# Patient Record
Sex: Male | Born: 1990 | Race: White | Hispanic: No | Marital: Single | Smoking: Current every day smoker
Health system: Southern US, Community
[De-identification: ages and names within clinical notes are randomized; demographics above are authoritative.]

## PROBLEM LIST (undated history)

## (undated) DIAGNOSIS — K4090 Unilateral inguinal hernia, without obstruction or gangrene, not specified as recurrent: Secondary | ICD-10-CM

---
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
## 2008-06-04 ENCOUNTER — Ambulatory Visit: Payer: Self-pay | Admitting: Family Medicine
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
## 2008-12-09 ENCOUNTER — Emergency Department: Payer: Self-pay | Admitting: Emergency Medicine

## 2008-12-12 ENCOUNTER — Emergency Department: Payer: Self-pay | Admitting: Emergency Medicine

## 2009-02-27 ENCOUNTER — Emergency Department: Payer: Self-pay | Admitting: Internal Medicine

## 2009-05-11 ENCOUNTER — Emergency Department: Payer: Self-pay | Admitting: Emergency Medicine

## 2009-07-14 ENCOUNTER — Emergency Department: Payer: Self-pay | Admitting: Emergency Medicine

## 2009-07-16 ENCOUNTER — Emergency Department: Payer: Self-pay | Admitting: Emergency Medicine

## 2009-07-23 ENCOUNTER — Emergency Department: Payer: Self-pay | Admitting: Emergency Medicine

## 2009-09-29 ENCOUNTER — Emergency Department: Payer: Self-pay | Admitting: Emergency Medicine

## 2010-04-27 ENCOUNTER — Emergency Department: Payer: Self-pay | Admitting: Emergency Medicine

## 2013-08-09 ENCOUNTER — Encounter (HOSPITAL_COMMUNITY): Payer: Self-pay | Admitting: Emergency Medicine

## 2013-08-09 ENCOUNTER — Emergency Department (HOSPITAL_COMMUNITY)
Admission: EM | Admit: 2013-08-09 | Discharge: 2013-08-10 | Disposition: A | Discharge status: Home / Self Care | Attending: Emergency Medicine | Admitting: Emergency Medicine

## 2013-08-09 DIAGNOSIS — R42 Dizziness and giddiness: Secondary | ICD-10-CM | POA: Insufficient documentation

## 2013-08-09 DIAGNOSIS — Z87891 Personal history of nicotine dependence: Secondary | ICD-10-CM | POA: Insufficient documentation

## 2013-08-09 DIAGNOSIS — K4090 Unilateral inguinal hernia, without obstruction or gangrene, not specified as recurrent: Secondary | ICD-10-CM | POA: Insufficient documentation

## 2013-08-09 HISTORY — DX: Unilateral inguinal hernia, without obstruction or gangrene, not specified as recurrent: K40.90

## 2013-08-09 NOTE — ED Notes (Signed)
Patient states he has had R inguinal hernia x 9 months.  Normally reduces when he lies down at night, but tonight it wouldn't and it is painful.

## 2013-08-09 NOTE — ED Notes (Signed)
Patient is from [facility]; patient has left inguinal hernia.  States having difficulty getting it back in and that it is painful.

## 2013-08-09 NOTE — ED Notes (Signed)
[employer] x 2 w/patient.

## 2013-08-09 NOTE — ED Provider Notes (Signed)
CSN: [id number]     Arrival date & time 08/09/13  2249 History  This chart was scribed for Brian Roller, MD by Quintella Reichert, ED scribe.  This patient was seen in room APA15/APA15 and the patient's care was started at 11:35 PM.   Chief Complaint  Patient presents with  . Inguinal Hernia    The history is provided by the patient. No language interpreter was used.    HPI Comments: Brian Stuart is a 22 y.o. male who presents to the Emergency Department complaining of a left inguinal hernia that has been present for 9 months but today became more painful and extended into his scrotum.  He states that the hernia normally pops out while standing up and today it popped out while he was standing up taking a shower.  Presently he complains of constant moderate-to-severe pain localized to the area of the hernia.  He is normally able to reduce the hernia by lying down but today he has not been able to.  He states he also felt mild lightheaded earlier today when his pain was most severe but he denies nausea, vomiting, or any other associated symptoms.      Past Medical History  Diagnosis Date  . Inguinal hernia     History reviewed. No pertinent past surgical history.  No family history on file.   History  Substance Use Topics  . Smoking status: Former Games developer  . Smokeless tobacco: Not on file  . Alcohol Use: No     Review of Systems  All other systems reviewed and are negative.     Allergies  Review of patient's allergies indicates no known allergies.  Home Medications  No current outpatient prescriptions on file.  BP 133/89  Pulse 72  Temp(Src) 98 F (36.7 C) (Oral)  Resp 18  Ht 5\' 5"  (1.651 m)  Wt 132 lb (59.875 kg)  BMI 21.97 kg/m2  SpO2 99%  Physical Exam  Nursing note and vitals reviewed. Constitutional: He is oriented to person, place, and time. He appears well-developed and well-nourished. No distress.  HENT:  Head: Normocephalic and atraumatic.   Mouth/Throat: Oropharynx is clear and moist.  Eyes: Conjunctivae are normal. Pupils are equal, round, and reactive to light. No scleral icterus.  Neck: Neck supple.  Cardiovascular: Normal rate, regular rhythm, normal heart sounds and intact distal pulses.   No murmur heard. Pulmonary/Chest: Effort normal and breath sounds normal. No stridor. No respiratory distress. He has no wheezes. He has no rales.  Abdominal: Soft. Bowel sounds are normal. He exhibits no distension. There is no tenderness. There is no rebound and no guarding. Hernia confirmed negative in the right inguinal area and confirmed negative in the left inguinal area.  Genitourinary: Testes normal.  Scrotum normal.  No inguinal hernia palpated.  Musculoskeletal: Normal range of motion. He exhibits no edema.  Neurological: He is alert and oriented to person, place, and time.  Skin: Skin is warm and dry. No rash noted.  Psychiatric: He has a normal mood and affect. His behavior is normal.    ED Course  Procedures (including critical care time)  DIAGNOSTIC STUDIES: Oxygen Saturation is 99% on room air, normal by my interpretation.    COORDINATION OF CARE: 11:38 PM-Discussed treatment plan with pt at bedside and pt agreed to plan.    Labs Review Labs Reviewed - No data to display  Imaging Review No results found.  EKG Interpretation   None       MDM  1. Left inguinal hernia    Pt has no hernia on my exam - he states that prior to my exam it reduced spontaneously.  No distress at this time - hernia belt Rx.    I personally performed the services described in this documentation, which was scribed in my presence. The recorded information has been reviewed and is accurate. '     Brian Roller, MD 08/09/13 2356

## 2013-08-10 NOTE — ED Notes (Addendum)
Patient with no complaints at this time. Respirations even and unlabored. Skin warm/dry. Discharge instructions reviewed with patient at this time. Patient given opportunity to voice concerns/ask questions. Patient discharged at this time and left Emergency Department with steady gait.   

## 2013-08-10 NOTE — ED Notes (Signed)
Report called to Teodora Medici, RN triage nurse.

## 2017-08-30 ENCOUNTER — Emergency Department
Admission: EM | Admit: 2017-08-30 | Discharge: 2017-08-30 | Disposition: A | Discharge status: Home / Self Care | Payer: Self-pay | Attending: Emergency Medicine | Admitting: Emergency Medicine

## 2017-08-30 ENCOUNTER — Emergency Department: Payer: Self-pay

## 2017-08-30 ENCOUNTER — Other Ambulatory Visit: Payer: Self-pay

## 2017-08-30 ENCOUNTER — Encounter: Payer: Self-pay | Admitting: Emergency Medicine

## 2017-08-30 DIAGNOSIS — N200 Calculus of kidney: Secondary | ICD-10-CM | POA: Insufficient documentation

## 2017-08-30 DIAGNOSIS — F17210 Nicotine dependence, cigarettes, uncomplicated: Secondary | ICD-10-CM | POA: Insufficient documentation

## 2017-08-30 DIAGNOSIS — N50819 Testicular pain, unspecified: Secondary | ICD-10-CM

## 2017-08-30 LAB — URINALYSIS, COMPLETE (UACMP) WITH MICROSCOPIC
BILIRUBIN URINE: NEGATIVE
Bacteria, UA: NONE SEEN
GLUCOSE, UA: NEGATIVE mg/dL
Ketones, ur: 5 mg/dL — AB
LEUKOCYTES UA: NEGATIVE
NITRITE: NEGATIVE
PH: 5 (ref 5.0–8.0)
Protein, ur: NEGATIVE mg/dL
SPECIFIC GRAVITY, URINE: 1.011 (ref 1.005–1.030)
Squamous Epithelial / LPF: NONE SEEN

## 2017-08-30 LAB — BASIC METABOLIC PANEL
ANION GAP: 10 (ref 5–15)
BUN: 13 mg/dL (ref 6–20)
CALCIUM: 9.5 mg/dL (ref 8.9–10.3)
CO2: 24 mmol/L (ref 22–32)
Chloride: 108 mmol/L (ref 101–111)
Creatinine, Ser: 0.96 mg/dL (ref 0.61–1.24)
Glucose, Bld: 105 mg/dL — ABNORMAL HIGH (ref 65–99)
POTASSIUM: 3.7 mmol/L (ref 3.5–5.1)
Sodium: 142 mmol/L (ref 135–145)

## 2017-08-30 LAB — CBC WITH DIFFERENTIAL/PLATELET
BASOS ABS: 0 10*3/uL (ref 0–0.1)
BASOS PCT: 0 %
EOS PCT: 0 %
Eosinophils Absolute: 0 10*3/uL (ref 0–0.7)
HEMATOCRIT: 47.7 % (ref 40.0–52.0)
Hemoglobin: 16.3 g/dL (ref 13.0–18.0)
Lymphocytes Relative: 8 %
Lymphs Abs: 1.2 10*3/uL (ref 1.0–3.6)
MCH: 29.6 pg (ref 26.0–34.0)
MCHC: 34.2 g/dL (ref 32.0–36.0)
MCV: 86.7 fL (ref 80.0–100.0)
Monocytes Absolute: 0.5 10*3/uL (ref 0.2–1.0)
Monocytes Relative: 4 %
NEUTROS ABS: 12.7 10*3/uL — AB (ref 1.4–6.5)
Neutrophils Relative %: 88 %
PLATELETS: 253 10*3/uL (ref 150–440)
RBC: 5.5 MIL/uL (ref 4.40–5.90)
RDW: 12.9 % (ref 11.5–14.5)
WBC: 14.5 10*3/uL — AB (ref 3.8–10.6)

## 2017-08-30 MED ORDER — ONDANSETRON 4 MG PO TBDP
4.0000 mg | ORAL_TABLET | Freq: Once | ORAL | Status: AC
  Administered 2017-08-30: 4 mg via ORAL
  Filled 2017-08-30: qty 1

## 2017-08-30 MED ORDER — ONDANSETRON HCL 4 MG PO TABS: 4.0000 mg | ORAL_TABLET | Freq: Three times a day (TID) | ORAL | 0 refills | Status: AC | PRN

## 2017-08-30 MED ORDER — IBUPROFEN 200 MG PO TABS: 600.0000 mg | ORAL_TABLET | Freq: Four times a day (QID) | ORAL | 0 refills | Status: AC | PRN

## 2017-08-30 MED ORDER — KETOROLAC TROMETHAMINE 30 MG/ML IJ SOLN
30.0000 mg | Freq: Once | INTRAMUSCULAR | Status: AC
  Administered 2017-08-30: 30 mg via INTRAMUSCULAR
  Filled 2017-08-30: qty 1

## 2017-08-30 NOTE — Discharge Instructions (Signed)
You have passed a kidney stone into her bladder, it should not causing more trouble, if you have fever, persistent pain, vomiting or you feel worse in any way return to the emergency department.  Follow closely with urology as an outpatient as you do have other kidney stones in your kidneys that will someday pass.

## 2017-08-30 NOTE — ED Triage Notes (Signed)
Pt to ed in handcuffs accompanied by [employer].  Pt was on the way to jail and began to complain of testicular pain and penis pain.  Pt states pain started about 730am today.  Pt writhing in chair.  Pt states nauseated.  Also reports last use of herioin was 2 days ago.

## 2017-08-30 NOTE — ED Provider Notes (Signed)
[facility] Emergency Department Provider Note  ____________________________________________   I have reviewed the triage vital signs and the nursing notes. Where available I have reviewed prior notes and, if possible and indicated, outside hospital notes.    HISTORY  Chief Complaint Testicle Pain    HPI Brian Stuart is a 27 y.o. male who presents today complaining of flank pain on the left going down towards his testicles, has been there for a few hours.  Has never had this before no history of kidney stones no other complaints or concerns   Location: Left flank Radiation: Towards scrotum Quality: Sharp Duration: 2 hours Timing: Waxes and wanes Severity: Significant when it is at its worst at this time not too bad Associated sxs: Vomited x1 PriorTreatment : None   Past Medical History:  Diagnosis Date  . Inguinal hernia     There are no active problems to display for this patient.   History reviewed. No pertinent surgical history.  Prior to Admission medications   Not on File    Allergies Patient has no known allergies.  History reviewed. No pertinent family history.  Social History Social History   Tobacco Use  . Smoking status: Current Every Day Smoker  . Smokeless tobacco: Never Used  Substance Use Topics  . Alcohol use: Yes  . Drug use: Yes    Types: Cocaine, Marijuana, Methamphetamines    Comment: heroin    Review of Systems Constitutional: No fever/chills Eyes: No visual changes. ENT: No sore throat. No stiff neck no neck pain Cardiovascular: Denies chest pain. Respiratory: Denies shortness of breath. Gastrointestinal:   no vomiting.  No diarrhea.  No constipation. Genitourinary: Negative for dysuria. Musculoskeletal: Negative lower extremity swelling Skin: Negative for rash. Neurological: Negative for severe headaches, focal weakness or numbness.   ____________________________________________   PHYSICAL  EXAM:  VITAL SIGNS: ED Triage Vitals  Enc Vitals Group     BP 08/30/17 1010 (!) 149/97     Pulse Rate 08/30/17 1010 77     Resp 08/30/17 1010 18     Temp 08/30/17 1010 (!) 97 F (36.1 C)     Temp Source 08/30/17 1010 Oral     SpO2 08/30/17 1010 100 %     Weight 08/30/17 1011 132 lb (59.9 kg)     Height 08/30/17 1011 5\' 5"  (1.651 m)     Head Circumference --      Peak Flow --      Pain Score 08/30/17 1010 10     Pain Loc --      Pain Edu? --      Excl. in GC? --     Constitutional: Alert and oriented. Well appearing and in no acute distress. Eyes: Conjunctivae are normal Head: Atraumatic HEENT: No congestion/rhinnorhea. Mucous membranes are moist.  Oropharynx non-erythematous Neck:   Nontender with no meningismus, no masses, no stridor Cardiovascular: Normal rate, regular rhythm. Grossly normal heart sounds.  Good peripheral circulation. Respiratory: Normal respiratory effort.  No retractions. Lungs CTAB. Abdominal: Soft and nontender. No distention. No guarding no rebound Back:  There is no focal tenderness or step off.  there is no midline tenderness there are no lesions noted. there is left CVA tenderness U: Normal external male genitalia no lesions noted nontender no testicular pain or masses, Musculoskeletal: No lower extremity tenderness, no upper extremity tenderness. No joint effusions, no DVT signs strong distal pulses no edema Neurologic:  Normal speech and language. No gross focal neurologic deficits are  appreciated.  Skin:  Skin is warm, dry and intact. No rash noted. Psychiatric: Mood and affect are normal. Speech and behavior are normal.  ____________________________________________   LABS (all labs ordered are listed, but only abnormal results are displayed)  Labs Reviewed  CBC WITH DIFFERENTIAL/PLATELET  BASIC METABOLIC PANEL  URINALYSIS, COMPLETE (UACMP) WITH MICROSCOPIC    Pertinent labs  results that were available during my care of the patient were  reviewed by me and considered in my medical decision making (see chart for details). ____________________________________________  EKG  I personally interpreted any EKGs ordered by me or triage  ____________________________________________  RADIOLOGY  Pertinent labs & imaging results that were available during my care of the patient were reviewed by me and considered in my medical decision making (see chart for details). If possible, patient and/or family made aware of any abnormal findings.  [place] Scrotom W/doppler  Result Date: 08/30/2017 CLINICAL DATA:  Acute bilateral testicle pain, left greater than right. EXAM: SCROTAL ULTRASOUND DOPPLER ULTRASOUND OF THE TESTICLES TECHNIQUE: Complete ultrasound examination of the testicles, epididymis, and other scrotal structures was performed. Color and spectral Doppler ultrasound were also utilized to evaluate blood flow to the testicles. COMPARISON:  None. FINDINGS: Right testicle Measurements: 4.6 x 2.1 x 2.8 cm. No mass or microlithiasis visualized. Left testicle Measurements: 4.1 x 2.3 x 2.7 cm. No mass or microlithiasis visualized. Right epididymis:  Normal in size.  5 mm cyst in the epididymis. Left epididymis:  Normal in size and appearance. Hydrocele:  None visualized. Varicocele:  Left varicocele. Pulsed Doppler interrogation of both testes demonstrates normal low resistance arterial and venous waveforms bilaterally. IMPRESSION: 1. Normal appearing testicles with normal perfusion bilaterally. 2. Left varicocele. Electronically Signed   By: Francene BoyersJames  Maxwell M.D.   On: 08/30/2017 11:25   ____________________________________________    PROCEDURES  Procedure(s) performed: None  Procedures  Critical Care performed: None  ____________________________________________   INITIAL IMPRESSION / ASSESSMENT AND PLAN / ED COURSE  Pertinent labs & imaging results that were available during my care of the patient were reviewed by me and considered in my  medical decision making (see chart for details).   Here with flank pain radiating to the scrotum, consistent with likely kidney stone if anything, no evidence of acute infection testicular exam is completely normal no evidence at this time of torsion etc.  We will give him tramadol, obtain CT scan and check basic blood work and reassess    ____________________________________________   FINAL CLINICAL IMPRESSION(S) / ED DIAGNOSES  Final diagnoses:  Testicle pain      This chart was dictated using voice recognition software.  Despite best efforts to proofread,  errors can occur which can change meaning.      Jeanmarie PlantMcShane, Natacia Chaisson A, MD 08/30/17 [phone]

## 2018-11-14 IMAGING — US US SCROTUM W/ DOPPLER COMPLETE
1 series · 14 of 25 positions shown · non-contrast
Comparison: None.

CLINICAL DATA: Acute bilateral testicle pain, left greater than
right.

EXAM:
SCROTAL ULTRASOUND
DOPPLER ULTRASOUND OF THE TESTICLES
TECHNIQUE: Complete ultrasound examination of the testicles, epididymis, and
other scrotal structures was performed. Color and spectral Doppler
ultrasound were also utilized to evaluate blood flow to the
testicles.

[Series 1: us scrotum w/ doppler complete · 0.08mm/px · 14 of 63 slices shown]
[im 1/63]
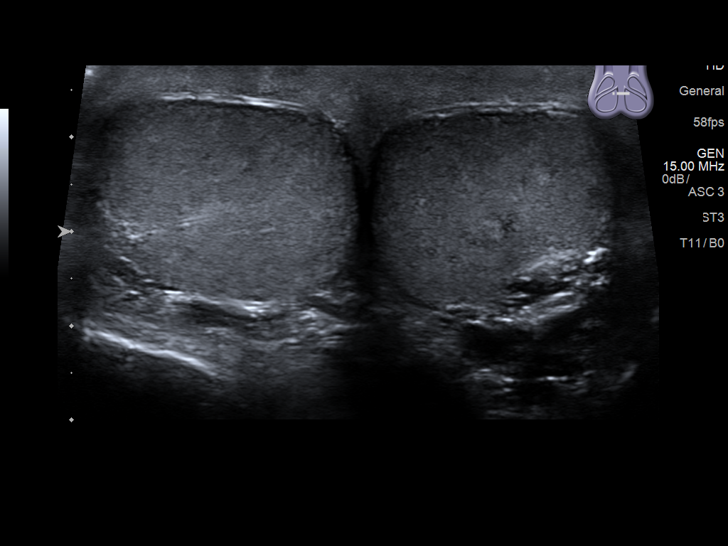
[im 6/63]
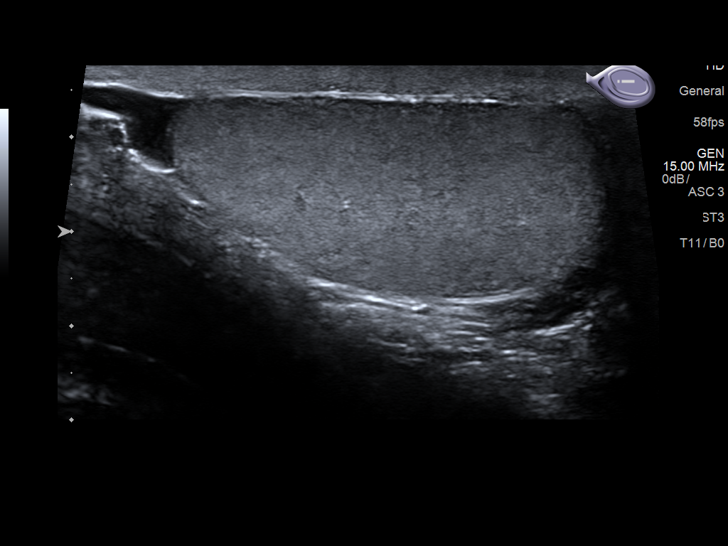
[im 11/63]
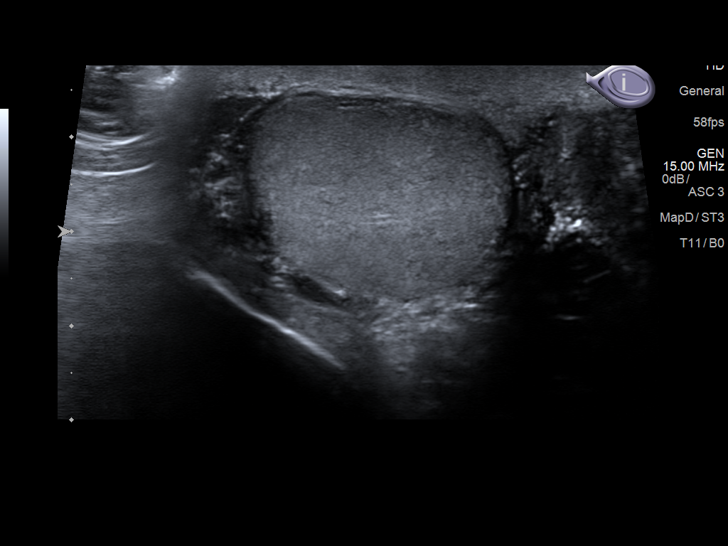
[im 16/63]
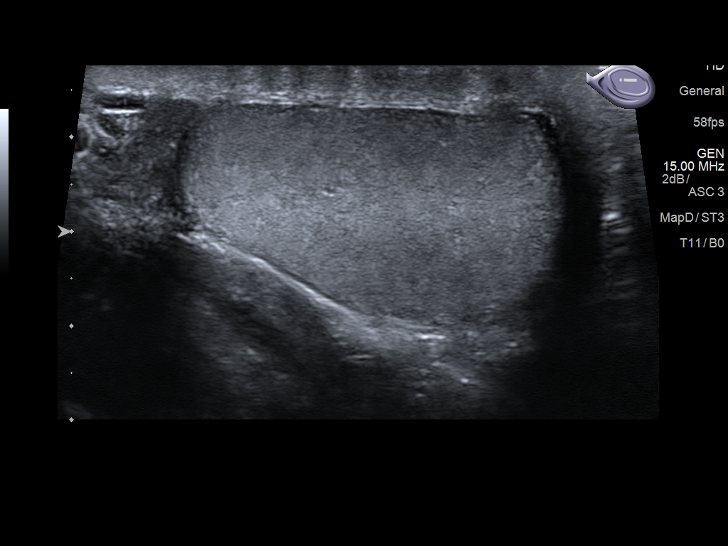
[im 21/63]
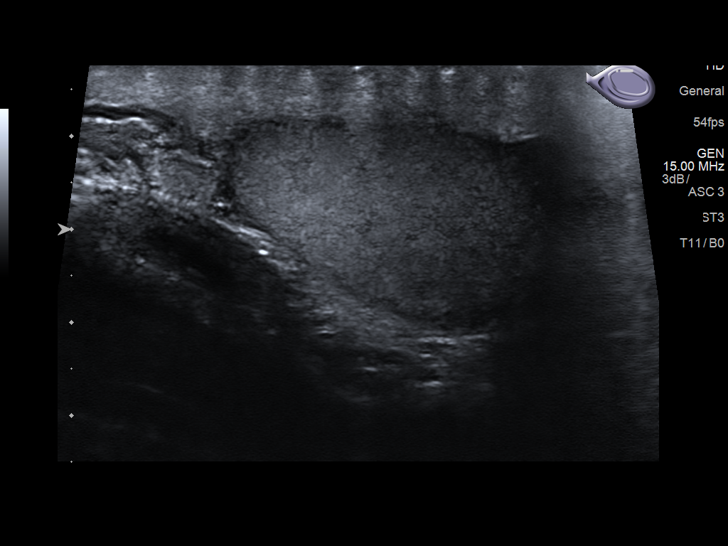
[im 24/63]
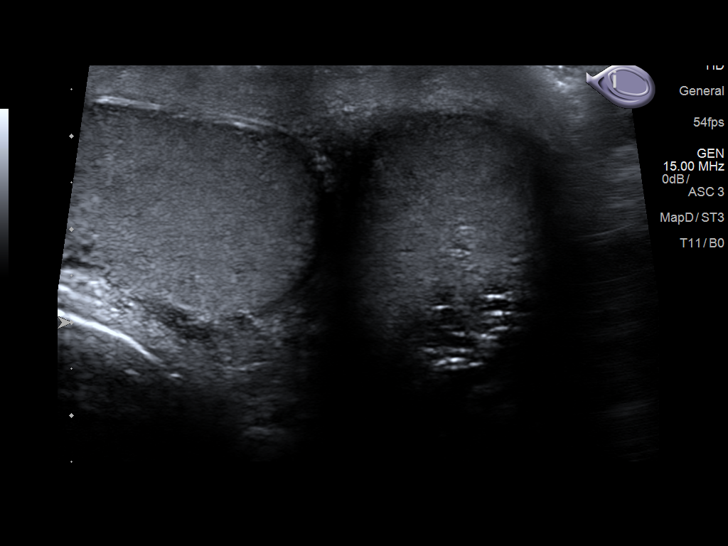
[im 29/63]
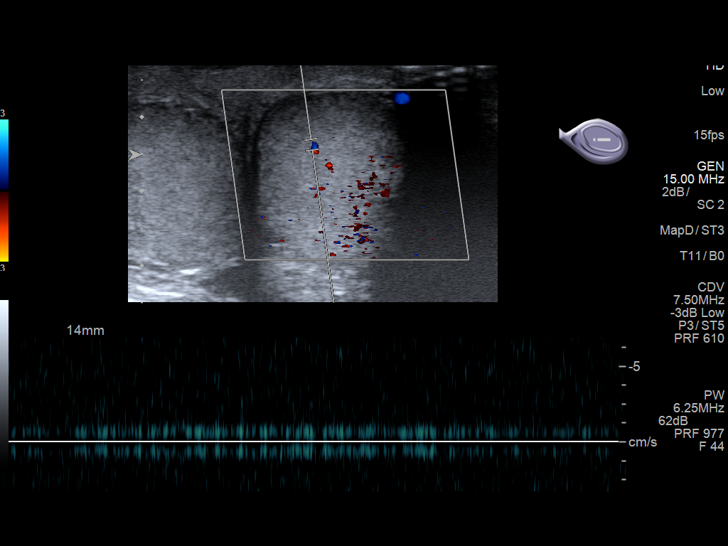
[im 34/63]
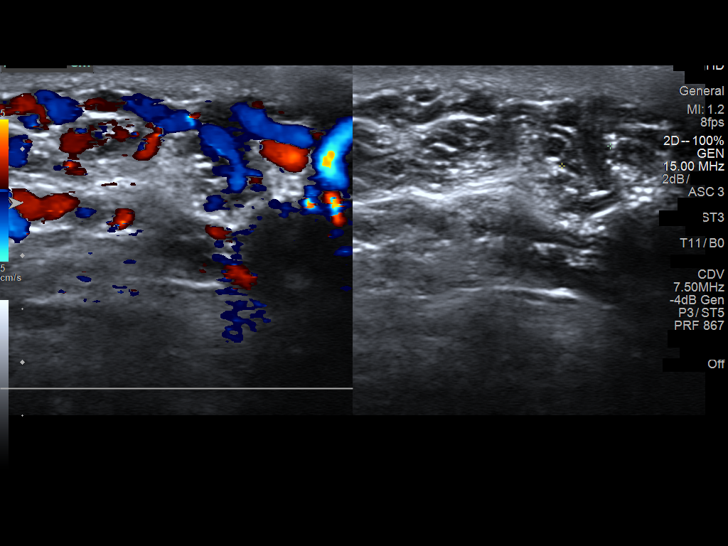
[im 39/63]
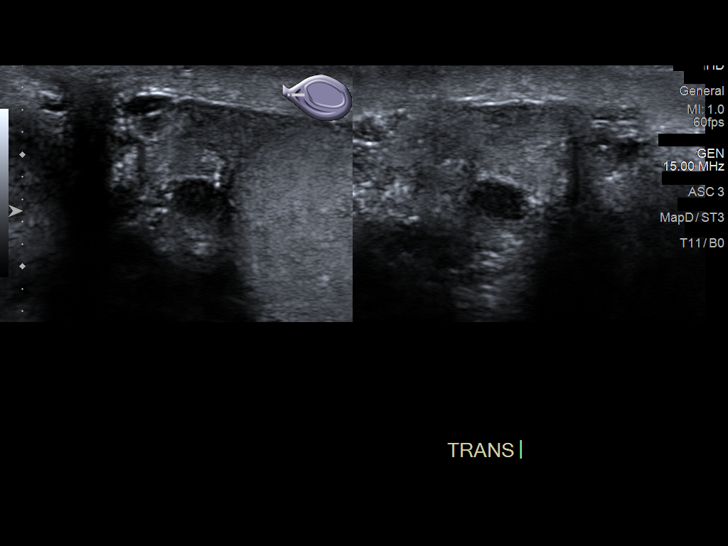
[im 42/63]
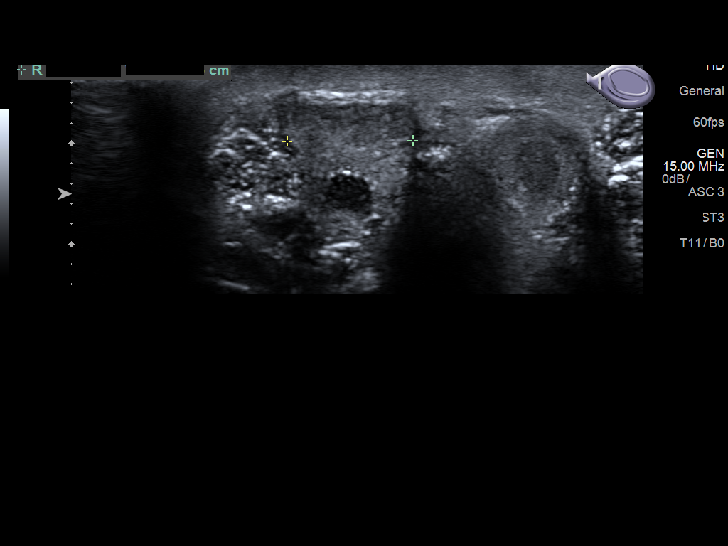
[im 47/63]
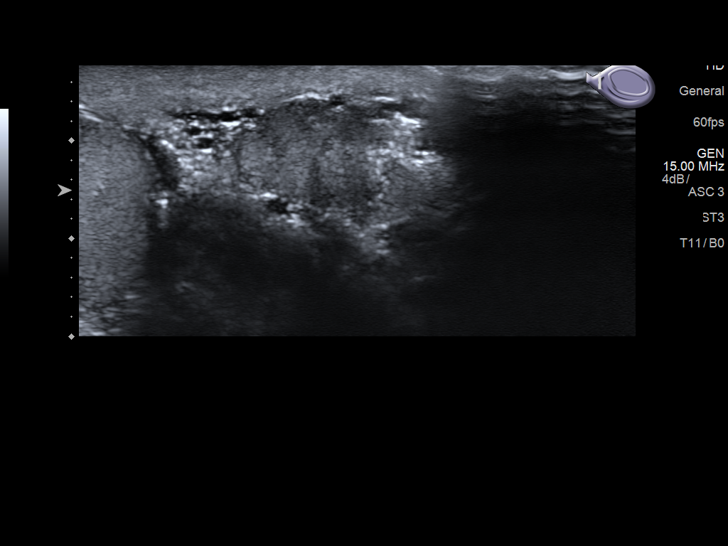
[im 52/63]
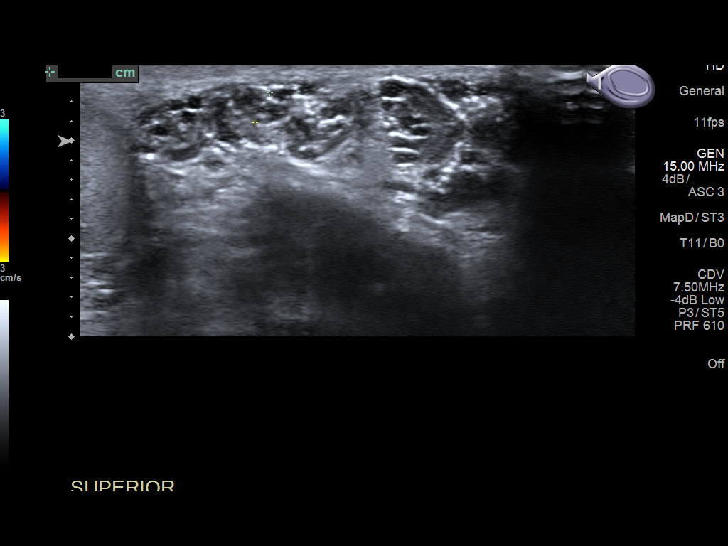
[im 57/63]
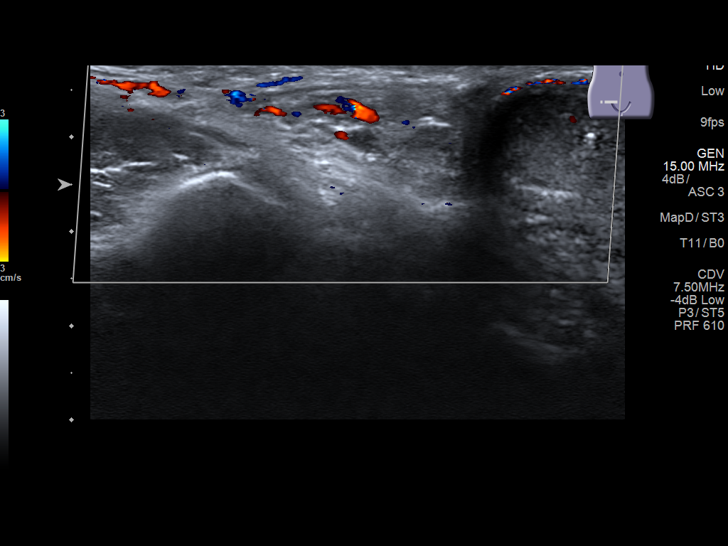
[im 63/63]
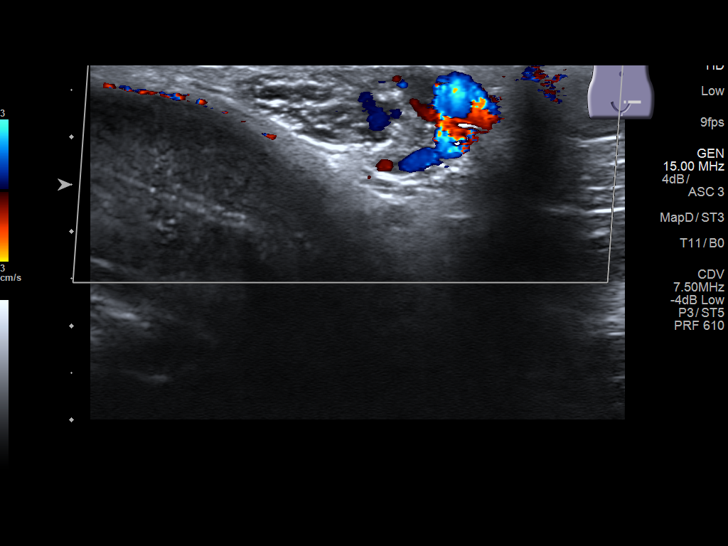

[14 of 25 positions shown; findings below may reference images not displayed]

FINDINGS: Right testicle

Measurements: 4.6 x 2.1 x 2.8 cm. No mass or microlithiasis
visualized.

Left testicle

Measurements: 4.1 x 2.3 x 2.7 cm. No mass or microlithiasis
visualized.

Right epididymis:  Normal in size.  5 mm cyst in the epididymis.

Left epididymis:  Normal in size and appearance.

Hydrocele:  None visualized.

Varicocele:  Left varicocele.

Pulsed Doppler interrogation of both testes demonstrates normal low
resistance arterial and venous waveforms bilaterally.
IMPRESSION: 1. Normal appearing testicles with normal perfusion bilaterally.
2. Left varicocele.

## 2019-01-29 IMAGING — CT CT RENAL STONE PROTOCOL
3 of 4 series · 9 of 46 positions shown, 16 images · non-contrast
Comparison: None.

CLINICAL DATA: 26-year-old male with history of left-sided flank
pain extending toward the testicles for the past several hours.

EXAM:
CT ABDOMEN AND PELVIS WITHOUT CONTRAST
TECHNIQUE: Multidetector CT imaging of the abdomen and pelvis was performed
following the standard protocol without IV contrast.

[Series 4: lung bases · axial · 0.67mm/px · z∈[-636,-556]mm · 5 of 26 slices shown, 10 images]
[im 5/26  soft-tissue]
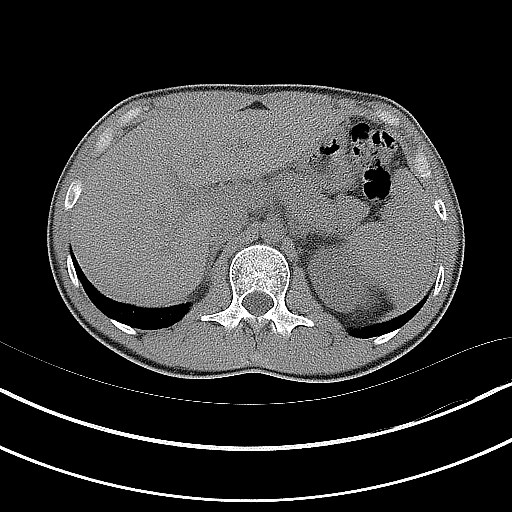
[im 5/26  bone]
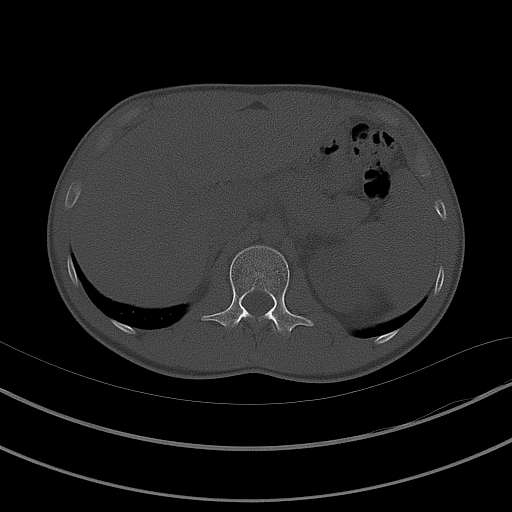
[im 9/26  soft-tissue]
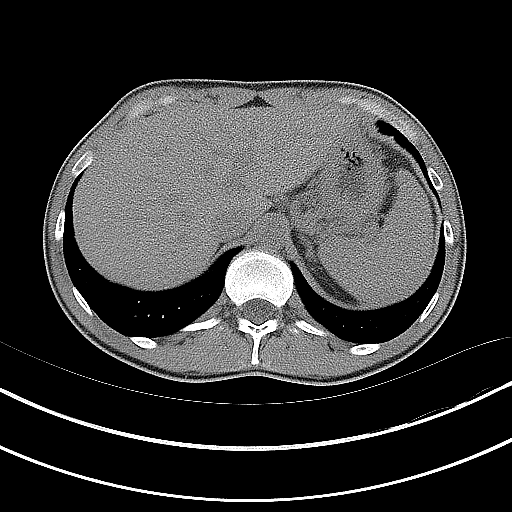
[im 9/26  lung]
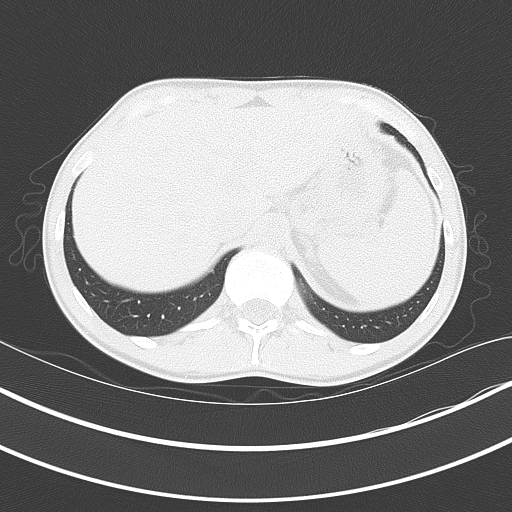
[im 13/26  soft-tissue]
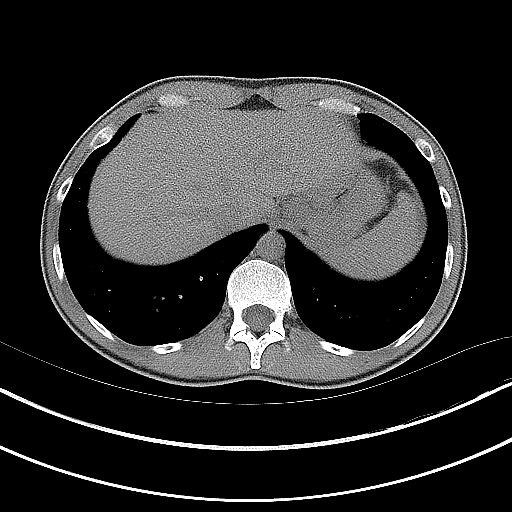
[im 13/26  lung]
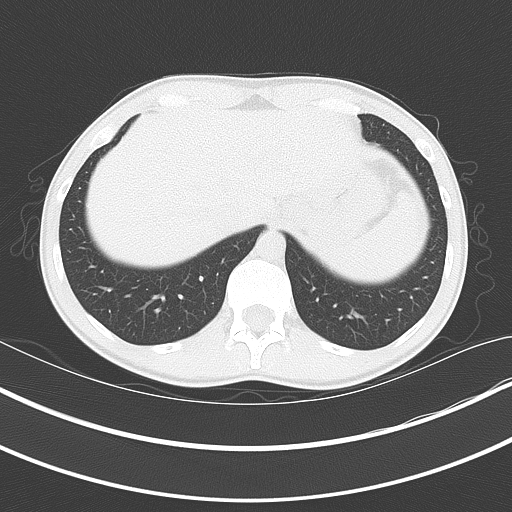
[im 17/26  soft-tissue]
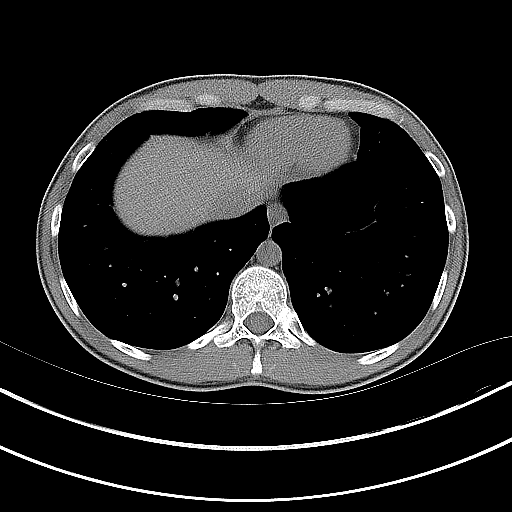
[im 17/26  lung]
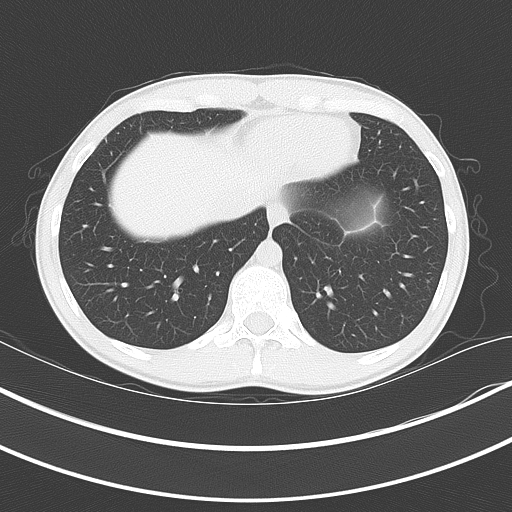
[im 21/26  soft-tissue]
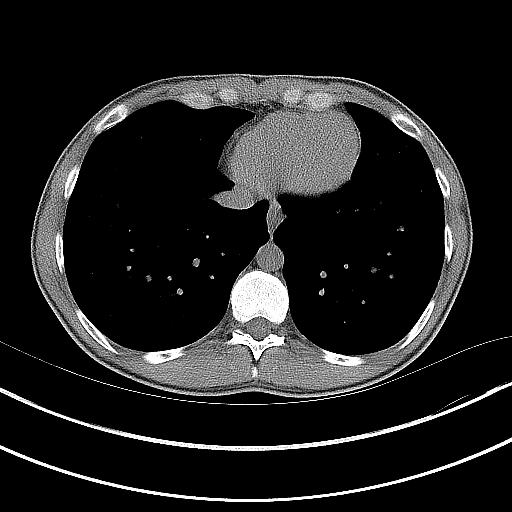
[im 21/26  lung]
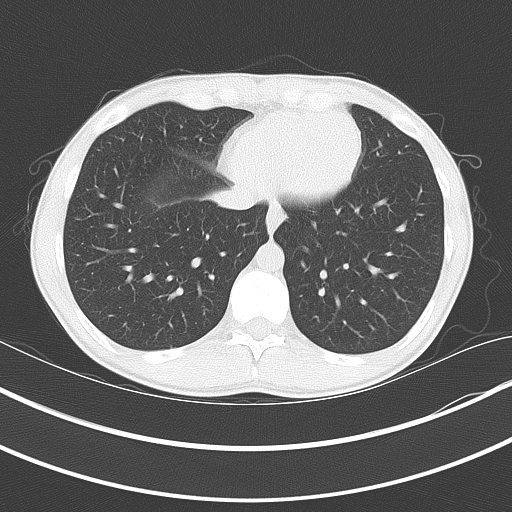

[Series 5: coronal · coronal · 0.87mm/px · 3 of 144 slices shown, 4 images]
[im 48/144  soft-tissue]
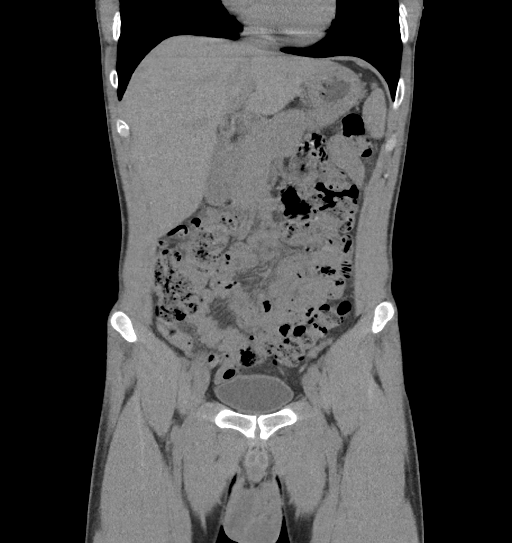
[im 64/144  soft-tissue]
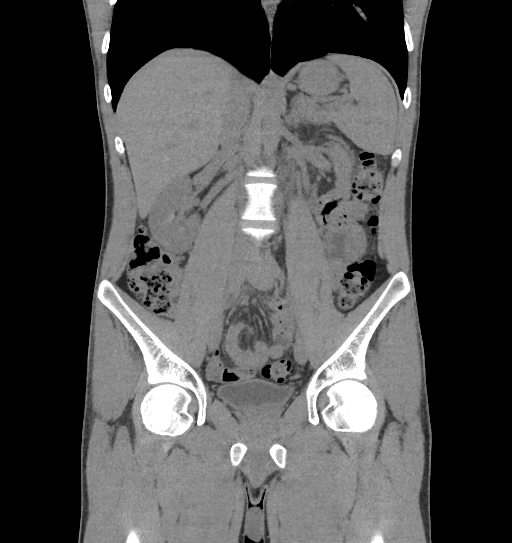
[im 64/144  bone]
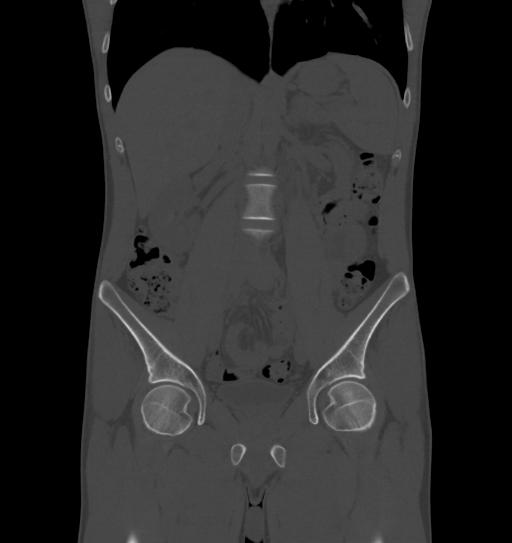
[im 80/144  soft-tissue]
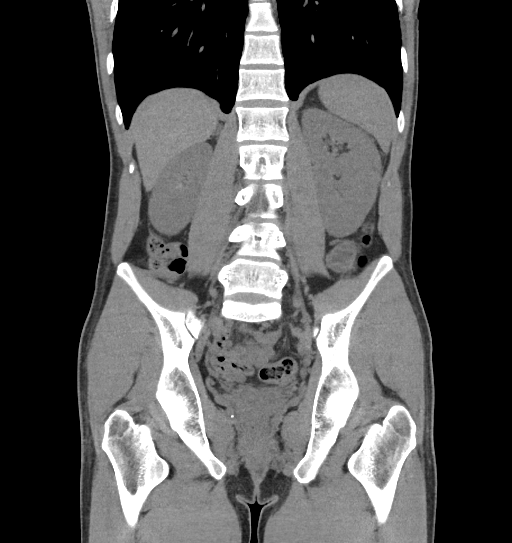

[Series 6: sagittal · sagittal · 0.55mm/px · 1 of 151 slices shown, 2 images]
[im 51/151  soft-tissue]
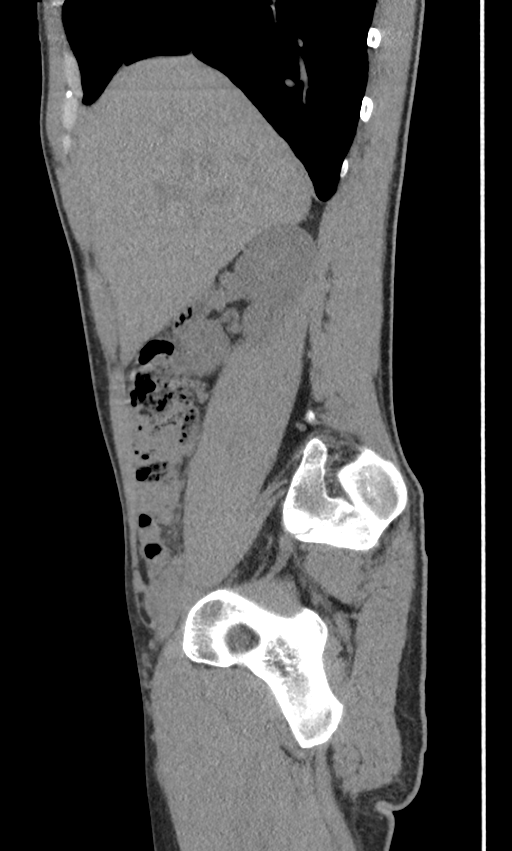
[im 51/151  bone]
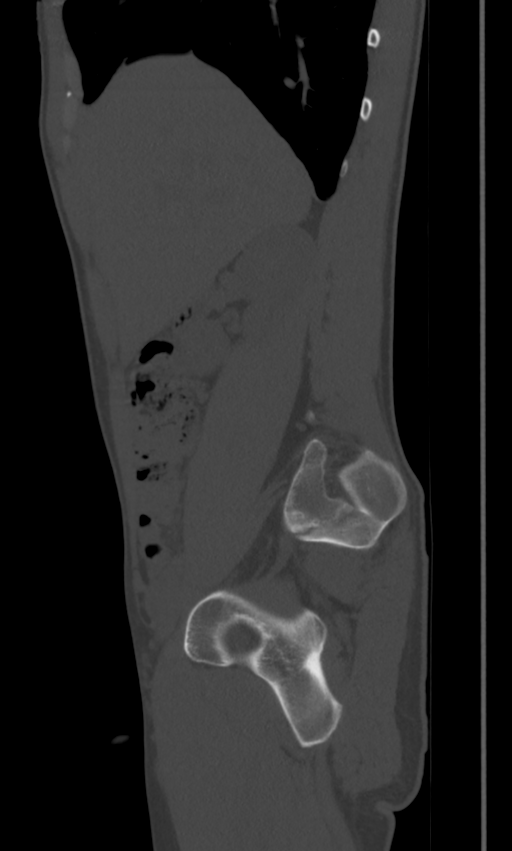

[9 of 46 positions shown; findings below may reference images not displayed]

FINDINGS: Lower chest: 2 tiny pulmonary nodules are noted in the lung bases,
largest of which is in the right lower lobe measuring 4 mm (axial
image 7 of series 4).

Hepatobiliary: No definite cystic or solid hepatic lesions are
identified on today's noncontrast CT examination. Unenhanced
appearance of the gallbladder is normal.

Pancreas: No definite pancreatic mass or peripancreatic inflammatory
changes are noted on today's noncontrast CT examination.

Spleen: Unremarkable.

Adrenals/Urinary Tract: Multiple nonobstructive calculi are noted
within the collecting systems of both kidneys, largest of which is a
3 mm calculus in the upper pole the right kidney. No additional
calculi are noted along the course of either ureter. No
hydroureteronephrosis. 3 mm calculus lying dependently in the
urinary bladder. Urinary bladder is otherwise unremarkable in
appearance. Bilateral adrenal glands are normal in appearance.

Stomach/Bowel: Normal appearance of the stomach. No pathologic
dilatation of small bowel or colon. Normal appendix.

Vascular/Lymphatic: No atherosclerotic calcifications in the
abdominal or pelvic vasculature. No lymphadenopathy noted in the
abdomen or pelvis.

Reproductive: Prostate gland and seminal vesicles are unremarkable
in appearance.

Other: Trace volume of ascites in the low anatomic pelvis. No
pneumoperitoneum.

Musculoskeletal: There are no aggressive appearing lytic or blastic
lesions noted in the visualized portions of the skeleton.
IMPRESSION: 1. Nonobstructive calculi in the collecting system of both kidneys
measuring up to 3 mm in the upper pole of the right kidney. In
addition, there is a 3 mm calculus lying dependently in the urinary
bladder. No ureteral stones or findings of urinary tract obstruction
are noted at this time.

## 2020-05-25 ENCOUNTER — Emergency Department: Admission: EM | Admit: 2020-05-25 | Discharge: 2020-05-25 | Discharge status: Against Medical Advice | Payer: Self-pay

## 2024-08-27 ENCOUNTER — Emergency Department
Admission: EM | Admit: 2024-08-27 | Discharge: 2024-08-27 | Disposition: A | Discharge status: Home / Self Care | Payer: Self-pay | Attending: Emergency Medicine | Admitting: Emergency Medicine

## 2024-08-27 ENCOUNTER — Other Ambulatory Visit: Payer: Self-pay

## 2024-08-27 DIAGNOSIS — K0889 Other specified disorders of teeth and supporting structures: Secondary | ICD-10-CM | POA: Insufficient documentation

## 2024-08-27 MED ORDER — CLINDAMYCIN HCL 150 MG PO CAPS
300.0000 mg | ORAL_CAPSULE | Freq: Three times a day (TID) | ORAL | 0 refills | Status: AC
  Filled 2024-08-27: qty 42, 7d supply, fill #0

## 2024-08-27 NOTE — Discharge Instructions (Signed)
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
## 2024-08-27 NOTE — ED Triage Notes (Signed)
 Pt c/o dental pain to right side x1 week. Pt does not have dentist. No swelling present to face. Pt has been taking ibuprofen .
